# Patient Record
Sex: Female | Born: 1955 | Race: White | Hispanic: No
Health system: Midwestern US, Community
[De-identification: ages and names within clinical notes are randomized; demographics above are authoritative.]

## PROBLEM LIST (undated history)

## (undated) DIAGNOSIS — J989 Respiratory disorder, unspecified: Secondary | ICD-10-CM

---

## 2014-05-13 ENCOUNTER — Encounter: Attending: Critical Care Medicine

## 2016-02-17 ENCOUNTER — Ambulatory Visit: Admit: 2016-02-17 | Discharge: 2016-02-17 | Payer: PRIVATE HEALTH INSURANCE | Attending: Adult Health

## 2016-02-17 DIAGNOSIS — J069 Acute upper respiratory infection, unspecified: Secondary | ICD-10-CM

## 2016-02-17 MED ORDER — HYDROCODONE 10 MG-CHLORPHENIRAMINE 8 MG/5 ML ORAL SUSP EXTEND.REL 12HR
10-8 mg/5 mL | Freq: Two times a day (BID) | ORAL | 0 refills | Status: AC | PRN
Start: 2016-02-17 — End: 2016-02-27

## 2016-02-17 MED ORDER — ALBUTEROL SULFATE HFA 90 MCG/ACTUATION AEROSOL INHALER
90 mcg/actuation | RESPIRATORY_TRACT | 1 refills | Status: AC | PRN
Start: 2016-02-17 — End: ?

## 2016-02-17 NOTE — Progress Notes (Addendum)
[facility]  A member of [facility]  [address] [place], [place] [address] * P - [phone] *F - [phone]      Office Progress Note    Primary Care Physician: None     CHIEF COMPLAINT:     Chief Complaint   Patient presents with   ??? Cold Symptoms     cold,dry cough,body ache,for 5 days.       HISTORY OF PRESENT ILLNESS:   Patient presents today for URI. Rates symptoms as a 7. Symptoms occur constantly. Associated symptoms are cough, body aches, nasal congestion and sore throat. Symptoms have been present for  5 days. There are no relieving factors.  There are no worsening factors.     Vitals:    02/17/16 1201   BP: 114/78   Pulse: 78   Resp: 16   Temp: 98.1 ??F (36.7 ??C)   SpO2: 97%   Weight: 186 lb 12.8 oz (84.7 kg)   Height: 5\' 9"  (1.753 m)       Body mass index is 27.59 kg/(m^2).  Pain Scale: /10      Past Medical History:  Past Medical History:   Diagnosis Date   ??? Hypothyroidism, acquired, autoimmune        Family History:  History reviewed. No pertinent family history.    PastSurgical History:  Past Surgical History:   Procedure Laterality Date   ??? HX BREAST AUGMENTATION     ??? HX LUMBAR FUSION     ??? HX ORTHOPAEDIC      foot surgery for plantar fascitis        Social History:  History   Smoking Status   ??? Former Smoker   Smokeless Tobacco   ??? Never Used     History   Alcohol Use   ??? Yes     Social History     Social History Narrative    Job: teacher       Allergy:  No Known Allergies    Medication:   Outpatient Prescriptions Marked as Taking for the 02/17/16 encounter (Office Visit) with Pamalee Leyden, APN   Medication Sig Dispense Refill   ??? levothyroxine (SYNTHROID) 150 mcg tablet Take  by mouth Daily (before breakfast).     ??? albuterol (PROVENTIL HFA, VENTOLIN HFA, PROAIR HFA) 90 mcg/actuation inhaler Take 2 Puffs by inhalation every four (4) hours as needed for Wheezing. 1 Inhaler 1   ??? chlorpheniramine-HYDROcodone (TUSSIONEX) 10-8 mg/5 mL suspension Take 5  mL by mouth every twelve (12) hours as needed for Cough for up to 10 days. Max Daily Amount: 10 mL. 115 mL 0         REVIEW OF SYSTEMS:     General: Patient denies fever, chills, or fatigue.  HEENT: Denies headache and dizziness. Denies eye pain, tearing or discharge. Denies difficulty seeing or blurred vision. Denies ear pain, discharge, tinnitus or change in hearing. C/o little nasal congestion  Lymph nodes:  Denies swelling or tenderness of lymph nodes.   Chest & Lungs: Denies difficulty breathing or shortness of breath. C/o dry cough  Heart: Denies chest pain or palpitations.   Skin: Denies changes in skin texture, pigmentation. Denies itching, rash, or eruption.   Musculoskeletal: Denies joint stiffness. Denies pain or restriction of ROM.   Neurologic: Denies syncope, seizures, numbness, slurring of speech, headache, loss of memory, and loss of consciousness.  Psychiatric: Denies disturbance of thoughts, mood changes, difficulty concentrating, sleep disturbances,  anxiety, or depression. Denies suicidal thoughts.           PHYSICAL EXAM:    GENERAL: No signs of distress. Alert and cooperative. Well groomed.   HEENT: Normocephalic. Facial symmetry present. Eyes conjunctivae pink, no tearing, no discharge. Sclera white. Cornea clear. Ears normal exam. Tympanic membrane translucent, pearly gray, with no perforations present. Nose & sinuses no nasal flaring. Mucosa pink and moist, no polyps or tenderness. Septum midline. Sinuses non-tender. No abnormal findings in throat or mouth.  NECK: No jugular vein distention. Trachea midline. Thyroid gland smooth, flat, not enlarges, non tender.  LYMPH: Lymph nodes nonpalpable.   HEART: S1S2 regular rate and rhythm. No murmurs, rub, thrills, bruits, or heaves. No extra heart sounds heard.   LUNGS: Clear to auscultation. No adventitious breath sounds.   SKIN: No rashes or lesions. Skin warm to touch. Turgor resilient. Nails  pink, smooth and cleans. Nails adhered to nail bed with brisk capillary refills.   MUSCULOSKELETAL:  Upper and lower extremities reveal no loss of strength or motion. There is no sensory deficit or instability noted. ROM is full without pain, locking or clicking.  NEUROLOGIC: Cranial nerves 2-12 grossly intact. The patient is alert and oriented x3. Normal mental status without any focal deficits.  PSYCH: The patient's affect is grossly normal, good eye contact, good affect, no suicidal ideations.       Data reviewed.  No results found for any visits on 02/17/16. .      ASSESSMENT:   1. Viral upper respiratory infection    2. Cough        PLAN:     Orders Placed This Encounter   ??? levothyroxine (SYNTHROID) 150 mcg tablet     Sig: Take  by mouth Daily (before breakfast).   ??? albuterol (PROVENTIL HFA, VENTOLIN HFA, PROAIR HFA) 90 mcg/actuation inhaler     Sig: Take 2 Puffs by inhalation every four (4) hours as needed for Wheezing.     Dispense:  1 Inhaler     Refill:  1   ??? chlorpheniramine-HYDROcodone (TUSSIONEX) 10-8 mg/5 mL suspension     Sig: Take 5 mL by mouth every twelve (12) hours as needed for Cough for up to 10 days. Max Daily Amount: 10 mL.     Dispense:  115 mL     Refill:  0       Discussed diagnosis and treatment.   Patient is tolerating all medications as prescribed with no barriers to adherence noted.  Medications discussed, including all new medications prescribed at this visit.  Indications and side effects reviewed with the patient/family/caregiver with understanding verbalized  I advised patient to call me if any unusual symptoms occur.    I have reviewed/discussed the above normal BMI with the patient.  I have recommended the following interventions: dietary management education, guidance, and counseling . .      Ms. Gerhart has a reminder for a "due or due soon" health maintenance. I have asked that she contact her primary care provider for follow-up on this health maintenance.       Follow-up Disposition:  Return if symptoms worsen or fail to improve.      Pamalee Leyden, APN         Patient Instructions          Cough: Care Instructions  Your Care Instructions    A cough is your body's response to something that bothers your throat or airways. Many things can cause a cough.  You might cough because of a cold or the flu, bronchitis, or asthma. Smoking, postnasal drip, allergies, and stomach acid that backs up into your throat also can cause coughs.  A cough is a symptom, not a disease. Most coughs stop when the cause, such as a cold, goes away. You can take a few steps at home to cough less and feel better.  Follow-up care is a key part of your treatment and safety. Be sure to make and go to all appointments, and call your doctor if you are having problems. It's also a good idea to know your test results and keep a list of the medicines you take.  How can you care for yourself at home?  ?? Drink lots of water and other fluids. This helps thin the mucus and soothes a dry or sore throat. Honey or lemon juice in hot water or tea may ease a dry cough.  ?? Take cough medicine as directed by your doctor.  ?? Prop up your head on pillows to help you breathe and ease a dry cough.  ?? Try cough drops to soothe a dry or sore throat. Cough drops don't stop a cough. Medicine-flavored cough drops are no better than candy-flavored drops or hard candy.  ?? Do not smoke. Avoid secondhand smoke. If you need help quitting, talk to your doctor about stop-smoking programs and medicines. These can increase your chances of quitting for good.  When should you call for help?  Call 911 anytime you think you may need emergency care. For example, call if:  ? ?? You have severe trouble breathing.   ?Call your doctor now or seek immediate medical care if:  ? ?? You cough up blood.   ? ?? You have new or worse trouble breathing.   ? ?? You have a new or higher fever.   ? ?? You have a new rash.    ?Watch closely for changes in your health, and be sure to contact your doctor if:  ? ?? You cough more deeply or more often, especially if you notice more mucus or a change in the color of your mucus.   ? ?? You have new symptoms, such as a sore throat, an earache, or sinus pain.   ? ?? You do not get better as expected.   Where can you learn more?  Go to [employer]Stats[employer]cahttp://www.healthwise.net/GoodHelpConnections.  Enter D279 in the search box to learn more about "Cough: Care Instructions."  Current as of: Jul 30, 2015  Content Version: 11.4  ?? 2006-2017 [employer]. Care instructions adapted under license by [employer] (which disclaims liability or warranty for this information). If you have questions about a medical condition or this instruction, always ask your healthcare professional. [employer]wise, Inc[employer] disclaims any warranty or liability for your use of this information.       Learning About COPD and Upper Respiratory Infections  What are upper respiratory infections?    An upper respiratory infection, also called a URI, is an infection of the nose, sinuses, or throat. Viruses or bacteria can cause URIs. Colds, the flu, and sinusitis are examples of URIs. These infections are spread by coughs, sneezes, and close contact.  How do these infections affect COPD?  A URI can worsen COPD symptoms, such as having too much mucus in your lungs, coughing, or being short of breath.  What can you do to manage most infections at home?  ?? Do not smoke. Avoid secondhand smoke.  ?? Take  an over-the-counter pain medicine, such as acetaminophen (Tylenol), ibuprofen (Advil, Motrin), or naproxen (Aleve). Read and follow all instructions on the label.  ?? Be careful when taking over-the-counter cold or flu medicines and Tylenol at the same time. Many of these medicines have acetaminophen, which is Tylenol. Read the labels to make sure that you are not taking  more than the recommended dose. Too much acetaminophen (Tylenol) can be harmful.  ?? If your doctor prescribed antibiotics, take them as directed. Do not stop taking them just because you feel better. You need to take the full course of antibiotics.  ?? Ask your doctor about cough medicines and decongestants. Some doctors recommend these medicines, while others feel that they do not help.  ?? Learn breathing techniques for COPD, such as breathing through pursed lips. These techniques can help you breathe easier.  What can you do to prevent these infections?  Stay healthy  ?? Do not smoke. This is the most important step you can take to prevent more damage to your lungs. If you need help quitting, talk to your doctor about stop-smoking programs and medicines. These can increase your chances of quitting for good.  ?? Avoid secondhand smoke, air pollution, and high altitudes. Also avoid cold, dry air and hot, humid air. Stay at home with your windows closed when air pollution is bad.  ?? Get a flu shot every year.  ?? Get a pneumococcal vaccine shot. If you have had one before, ask your doctor whether you need another dose.  ?? If you must be around people with colds or the flu, wash your hands often.  Exercise and eat well  ?? If your doctor recommends it, get more exercise. Walking is a good choice. Bit by bit, increase the amount you walk every day. Try for at least 30 minutes on most days of the week.  ?? Eat regular, well-balanced meals. Eating right keeps your energy levels up and helps your body fight infection.  ?? Get plenty of rest and sleep.  Follow-up care is a key part of your treatment and safety. Be sure to make and go to all appointments, and call your doctor if you are having problems. It's also a good idea to know your test results and keep a list of the medicines you take.  Where can you learn more?  Go to InsuranceStats.cahttp://www.healthwise.net/GoodHelpConnections.   Enter (813) 533-4087V912 in the search box to learn more about "Learning About COPD and Upper Respiratory Infections."  Current as of: Jul 30, 2015  Content Version: 11.4  ?? 2006-2017 [employer]. Care instructions adapted under license by [employer] (which disclaims liability or warranty for this information). If you have questions about a medical condition or this instruction, always ask your healthcare professional. [employer]wise, [employer] disclaims any warranty or liability for your use of this information.       Cough: Care Instructions  Your Care Instructions    A cough is your body's response to something that bothers your throat or airways. Many things can cause a cough. You might cough because of a cold or the flu, bronchitis, or asthma. Smoking, postnasal drip, allergies, and stomach acid that backs up into your throat also can cause coughs.  A cough is a symptom, not a disease. Most coughs stop when the cause, such as a cold, goes away. You can take a few steps at home to cough less and feel better.  Follow-up care is a key part of your treatment and safety.  Be sure to make and go to all appointments, and call your doctor if you are having problems. It's also a good idea to know your test results and keep a list of the medicines you take.  How can you care for yourself at home?  ?? Drink lots of water and other fluids. This helps thin the mucus and soothes a dry or sore throat. Honey or lemon juice in hot water or tea may ease a dry cough.  ?? Take cough medicine as directed by your doctor.  ?? Prop up your head on pillows to help you breathe and ease a dry cough.  ?? Try cough drops to soothe a dry or sore throat. Cough drops don't stop a cough. Medicine-flavored cough drops are no better than candy-flavored drops or hard candy.  ?? Do not smoke. Avoid secondhand smoke. If you need help quitting, talk to your doctor about stop-smoking programs and medicines. These can increase  your chances of quitting for good.  When should you call for help?  Call 911 anytime you think you may need emergency care. For example, call if:  ? ?? You have severe trouble breathing.   ?Call your doctor now or seek immediate medical care if:  ? ?? You cough up blood.   ? ?? You have new or worse trouble breathing.   ? ?? You have a new or higher fever.   ? ?? You have a new rash.   ?Watch closely for changes in your health, and be sure to contact your doctor if:  ? ?? You cough more deeply or more often, especially if you notice more mucus or a change in the color of your mucus.   ? ?? You have new symptoms, such as a sore throat, an earache, or sinus pain.   ? ?? You do not get better as expected.   Where can you learn more?  Go to [employer].  Enter D279 in the search box to learn more about "Cough: Care Instructions."  Current as of: Jul 30, 2015  Content Version: 11.4  ?? 2006-2017 [employer]. Care instructions adapted under license by [employer] (which disclaims liability or warranty for this information). If you have questions about a medical condition or this instruction, always ask your healthcare professional. [employer] disclaims any warranty or liability for your use of this information.       Upper Respiratory Infection (Cold): Care Instructions  Your Care Instructions    An upper respiratory infection, or URI, is an infection of the nose, sinuses, or throat. URIs are spread by coughs, sneezes, and direct contact. The common cold is the most frequent kind of URI. The flu and sinus infections are other kinds of URIs.  Almost all URIs are caused by viruses. Antibiotics won't cure them. But you can treat most infections with home care. This may include drinking lots of fluids and taking over-the-counter pain medicine. You will probably feel better in 4 to 10 days.  The doctor has checked you carefully, but problems can develop later. If  you notice any problems or new symptoms, get medical treatment right away.  Follow-up care is a key part of your treatment and safety. Be sure to make and go to all appointments, and call your doctor if you are having problems. It's also a good idea to know your test results and keep a list of the medicines you take.  How can you care for yourself at home?  ??  To prevent dehydration, drink plenty of fluids, enough so that your urine is light yellow or clear like water. Choose water and other caffeine-free clear liquids until you feel better. If you have kidney, heart, or liver disease and have to limit fluids, talk with your doctor before you increase the amount of fluids you drink.  ?? Take an over-the-counter pain medicine, such as acetaminophen (Tylenol), ibuprofen (Advil, Motrin), or naproxen (Aleve). Read and follow all instructions on the label.  ?? Before you use cough and cold medicines, check the label. These medicines may not be safe for young children or for people with certain health problems.  ?? Be careful when taking over-the-counter cold or flu medicines and Tylenol at the same time. Many of these medicines have acetaminophen, which is Tylenol. Read the labels to make sure that you are not taking more than the recommended dose. Too much acetaminophen (Tylenol) can be harmful.  ?? Get plenty of rest.  ?? Do not smoke or allow others to smoke around you. If you need help quitting, talk to your doctor about stop-smoking programs and medicines. These can increase your chances of quitting for good.  When should you call for help?  Call 911 anytime you think you may need emergency care. For example, call if:  ? ?? You have severe trouble breathing.   ?Call your doctor now or seek immediate medical care if:  ? ?? You seem to be getting much sicker.   ? ?? You have new or worse trouble breathing.   ? ?? You have a new or higher fever.   ? ?? You have a new rash.    ?Watch closely for changes in your health, and be sure to contact your doctor if:  ? ?? You have a new symptom, such as a sore throat, an earache, or sinus pain.   ? ?? You cough more deeply or more often, especially if you notice more mucus or a change in the color of your mucus.   ? ?? You do not get better as expected.   Where can you learn more?  Go to [employer].  Enter [phone] in the search box to learn more about "Upper Respiratory Infection (Cold): Care Instructions."  Current as of: Jul 30, 2015  Content Version: 11.4  ?? 2006-2017 [employer]. Care instructions adapted under license by [employer] (which disclaims liability or warranty for this information). If you have questions about a medical condition or this instruction, always ask your healthcare professional. [employer], [employer] disclaims any warranty or liability for your use of this information.          02/17/16    I reviewed the patient's medical history, the nurse practitioner's findings on physical examination, the patient's diagnoses, and treatment plan as documented in the progress note. I concur with the treatment plan as documented. There are no additional recommendations at this time.    Lanelle Bal, DO

## 2016-02-17 NOTE — Patient Instructions (Signed)
Cough: Care Instructions  Your Care Instructions    A cough is your body's response to something that bothers your throat or airways. Many things can cause a cough. You might cough because of a cold or the flu, bronchitis, or asthma. Smoking, postnasal drip, allergies, and stomach acid that backs up into your throat also can cause coughs.  A cough is a symptom, not a disease. Most coughs stop when the cause, such as a cold, goes away. You can take a few steps at home to cough less and feel better.  Follow-up care is a key part of your treatment and safety. Be sure to make and go to all appointments, and call your doctor if you are having problems. It's also a good idea to know your test results and keep a list of the medicines you take.  How can you care for yourself at home?  ?? Drink lots of water and other fluids. This helps thin the mucus and soothes a dry or sore throat. Honey or lemon juice in hot water or tea may ease a dry cough.  ?? Take cough medicine as directed by your doctor.  ?? Prop up your head on pillows to help you breathe and ease a dry cough.  ?? Try cough drops to soothe a dry or sore throat. Cough drops don't stop a cough. Medicine-flavored cough drops are no better than candy-flavored drops or hard candy.  ?? Do not smoke. Avoid secondhand smoke. If you need help quitting, talk to your doctor about stop-smoking programs and medicines. These can increase your chances of quitting for good.  When should you call for help?  Call 911 anytime you think you may need emergency care. For example, call if:  ? ?? You have severe trouble breathing.   ?Call your doctor now or seek immediate medical care if:  ? ?? You cough up blood.   ? ?? You have new or worse trouble breathing.   ? ?? You have a new or higher fever.   ? ?? You have a new rash.   ?Watch closely for changes in your health, and be sure to contact your doctor if:  ? ?? You cough more deeply or more often, especially if you notice more  mucus or a change in the color of your mucus.   ? ?? You have new symptoms, such as a sore throat, an earache, or sinus pain.   ? ?? You do not get better as expected.   Where can you learn more?  Go to [employer]cahttp://www.healthwise.net/GoodHelpConnections.  Enter D279 in the search box to learn more about "Cough: Care Instructions."  Current as of: Jul 30, 2015  Content Version: 11.4  ?? 2006-2017 [employer]. Care instructions adapted under license by [employer] (which disclaims liability or warranty for this information). If you have questions about a medical condition or this instruction, always ask your healthcare professional. [employer] disclaims any warranty or liability for your use of this information.       Learning About COPD and Upper Respiratory Infections  What are upper respiratory infections?    An upper respiratory infection, also called a URI, is an infection of the nose, sinuses, or throat. Viruses or bacteria can cause URIs. Colds, the flu, and sinusitis are examples of URIs. These infections are spread by coughs, sneezes, and close contact.  How do these infections affect COPD?  A URI can worsen COPD symptoms, such as having too much mucus in  your lungs, coughing, or being short of breath.  What can you do to manage most infections at home?  ?? Do not smoke. Avoid secondhand smoke.  ?? Take an over-the-counter pain medicine, such as acetaminophen (Tylenol), ibuprofen (Advil, Motrin), or naproxen (Aleve). Read and follow all instructions on the label.  ?? Be careful when taking over-the-counter cold or flu medicines and Tylenol at the same time. Many of these medicines have acetaminophen, which is Tylenol. Read the labels to make sure that you are not taking more than the recommended dose. Too much acetaminophen (Tylenol) can be harmful.  ?? If your doctor prescribed antibiotics, take them as directed. Do not  stop taking them just because you feel better. You need to take the full course of antibiotics.  ?? Ask your doctor about cough medicines and decongestants. Some doctors recommend these medicines, while others feel that they do not help.  ?? Learn breathing techniques for COPD, such as breathing through pursed lips. These techniques can help you breathe easier.  What can you do to prevent these infections?  Stay healthy  ?? Do not smoke. This is the most important step you can take to prevent more damage to your lungs. If you need help quitting, talk to your doctor about stop-smoking programs and medicines. These can increase your chances of quitting for good.  ?? Avoid secondhand smoke, air pollution, and high altitudes. Also avoid cold, dry air and hot, humid air. Stay at home with your windows closed when air pollution is bad.  ?? Get a flu shot every year.  ?? Get a pneumococcal vaccine shot. If you have had one before, ask your doctor whether you need another dose.  ?? If you must be around people with colds or the flu, wash your hands often.  Exercise and eat well  ?? If your doctor recommends it, get more exercise. Walking is a good choice. Bit by bit, increase the amount you walk every day. Try for at least 30 minutes on most days of the week.  ?? Eat regular, well-balanced meals. Eating right keeps your energy levels up and helps your body fight infection.  ?? Get plenty of rest and sleep.  Follow-up care is a key part of your treatment and safety. Be sure to make and go to all appointments, and call your doctor if you are having problems. It's also a good idea to know your test results and keep a list of the medicines you take.  Where can you learn more?  Go to InsuranceStats.cahttp://www.healthwise.net/GoodHelpConnections.  Enter [phone] in the search box to learn more about "Learning About COPD and Upper Respiratory Infections."  Current as of: Jul 30, 2015  Content Version: 11.4   ?? 2006-2017 [employer]. Care instructions adapted under license by [employer] (which disclaims liability or warranty for this information). If you have questions about a medical condition or this instruction, always ask your healthcare professional. [employer]wise, [employer] disclaims any warranty or liability for your use of this information.       Cough: Care Instructions  Your Care Instructions    A cough is your body's response to something that bothers your throat or airways. Many things can cause a cough. You might cough because of a cold or the flu, bronchitis, or asthma. Smoking, postnasal drip, allergies, and stomach acid that backs up into your throat also can cause coughs.  A cough is a symptom, not a disease. Most coughs stop when the cause, such as  a cold, goes away. You can take a few steps at home to cough less and feel better.  Follow-up care is a key part of your treatment and safety. Be sure to make and go to all appointments, and call your doctor if you are having problems. It's also a good idea to know your test results and keep a list of the medicines you take.  How can you care for yourself at home?  ?? Drink lots of water and other fluids. This helps thin the mucus and soothes a dry or sore throat. Honey or lemon juice in hot water or tea may ease a dry cough.  ?? Take cough medicine as directed by your doctor.  ?? Prop up your head on pillows to help you breathe and ease a dry cough.  ?? Try cough drops to soothe a dry or sore throat. Cough drops don't stop a cough. Medicine-flavored cough drops are no better than candy-flavored drops or hard candy.  ?? Do not smoke. Avoid secondhand smoke. If you need help quitting, talk to your doctor about stop-smoking programs and medicines. These can increase your chances of quitting for good.  When should you call for help?  Call 911 anytime you think you may need emergency care. For example, call if:   ? ?? You have severe trouble breathing.   ?Call your doctor now or seek immediate medical care if:  ? ?? You cough up blood.   ? ?? You have new or worse trouble breathing.   ? ?? You have a new or higher fever.   ? ?? You have a new rash.   ?Watch closely for changes in your health, and be sure to contact your doctor if:  ? ?? You cough more deeply or more often, especially if you notice more mucus or a change in the color of your mucus.   ? ?? You have new symptoms, such as a sore throat, an earache, or sinus pain.   ? ?? You do not get better as expected.   Where can you learn more?  Go to [employer].  Enter D279 in the search box to learn more about "Cough: Care Instructions."  Current as of: Jul 30, 2015  Content Version: 11.4  ?? 2006-2017 [employer]. Care instructions adapted under license by [employer] (which disclaims liability or warranty for this information). If you have questions about a medical condition or this instruction, always ask your healthcare professional. [employer] disclaims any warranty or liability for your use of this information.       Upper Respiratory Infection (Cold): Care Instructions  Your Care Instructions    An upper respiratory infection, or URI, is an infection of the nose, sinuses, or throat. URIs are spread by coughs, sneezes, and direct contact. The common cold is the most frequent kind of URI. The flu and sinus infections are other kinds of URIs.  Almost all URIs are caused by viruses. Antibiotics won't cure them. But you can treat most infections with home care. This may include drinking lots of fluids and taking over-the-counter pain medicine. You will probably feel better in 4 to 10 days.  The doctor has checked you carefully, but problems can develop later. If you notice any problems or new symptoms, get medical treatment right away.   Follow-up care is a key part of your treatment and safety. Be sure to make and go to all appointments, and call your doctor if you are having problems.  It's also a good idea to know your test results and keep a list of the medicines you take.  How can you care for yourself at home?  ?? To prevent dehydration, drink plenty of fluids, enough so that your urine is light yellow or clear like water. Choose water and other caffeine-free clear liquids until you feel better. If you have kidney, heart, or liver disease and have to limit fluids, talk with your doctor before you increase the amount of fluids you drink.  ?? Take an over-the-counter pain medicine, such as acetaminophen (Tylenol), ibuprofen (Advil, Motrin), or naproxen (Aleve). Read and follow all instructions on the label.  ?? Before you use cough and cold medicines, check the label. These medicines may not be safe for young children or for people with certain health problems.  ?? Be careful when taking over-the-counter cold or flu medicines and Tylenol at the same time. Many of these medicines have acetaminophen, which is Tylenol. Read the labels to make sure that you are not taking more than the recommended dose. Too much acetaminophen (Tylenol) can be harmful.  ?? Get plenty of rest.  ?? Do not smoke or allow others to smoke around you. If you need help quitting, talk to your doctor about stop-smoking programs and medicines. These can increase your chances of quitting for good.  When should you call for help?  Call 911 anytime you think you may need emergency care. For example, call if:  ? ?? You have severe trouble breathing.   ?Call your doctor now or seek immediate medical care if:  ? ?? You seem to be getting much sicker.   ? ?? You have new or worse trouble breathing.   ? ?? You have a new or higher fever.   ? ?? You have a new rash.   ?Watch closely for changes in your health, and be sure to contact your doctor if:   ? ?? You have a new symptom, such as a sore throat, an earache, or sinus pain.   ? ?? You cough more deeply or more often, especially if you notice more mucus or a change in the color of your mucus.   ? ?? You do not get better as expected.   Where can you learn more?  Go to [employer].  Enter [phone] in the search box to learn more about "Upper Respiratory Infection (Cold): Care Instructions."  Current as of: Jul 30, 2015  Content Version: 11.4  ?? 2006-2017 [employer]. Care instructions adapted under license by [employer] (which disclaims liability or warranty for this information). If you have questions about a medical condition or this instruction, always ask your healthcare professional. [employer], [employer] disclaims any warranty or liability for your use of this information.

## 2016-02-18 MED ORDER — AMOXICILLIN CLAVULANATE 875 MG-125 MG TAB
875-125 mg | ORAL_TABLET | Freq: Two times a day (BID) | ORAL | 0 refills | Status: AC
Start: 2016-02-18 — End: 2016-02-28

## 2016-02-18 NOTE — Telephone Encounter (Signed)
Called in antibiotic  Katherine LeydenAshley A Zuniga Morissette, APN

## 2016-02-18 NOTE — Telephone Encounter (Signed)
Called patient and gave her the message.

## 2016-02-18 NOTE — Telephone Encounter (Signed)
Patient wanted to let you know her sputum was green. Said you would prescribe something for her.

## 2016-03-08 ENCOUNTER — Encounter

## 2016-03-08 ENCOUNTER — Ambulatory Visit: Admit: 2016-03-08 | Discharge: 2016-03-08 | Payer: PRIVATE HEALTH INSURANCE | Attending: Pulmonary Disease

## 2016-03-08 DIAGNOSIS — R0989 Other specified symptoms and signs involving the circulatory and respiratory systems: Secondary | ICD-10-CM

## 2016-03-08 LAB — AMB POC PFT FULLSET (WEST NYACK)
DLCOunc: 29.61 ml/min/mmHg
FEV1/FVC: 75 %
FEV1: 2.91 L
FVC: 3.87 L
RV (Pleth): 2.72 L
TLC (Pleth): 6.35 L

## 2016-03-08 NOTE — Patient Instructions (Signed)
Albuterol 2 puffs up to 4 x day, only as needed    Tessalon 3 x day as needed      Mucinex  2 x day

## 2016-03-08 NOTE — Progress Notes (Signed)
[facility]    Pulmonary Consultation    Primary Care Physician: None     Reason for Visit:  sob    History of Present Illness:  Katherine Zuniga is a 60 y.o. female patient who presents with >ppd for 15 years, stopped  15 years ago.  Low dose CAT 2 years ago at [facility].    Sick for 2-3 weeks with persistent dry cough.  No fever, chills, sweats. AM scant sputum, sp Amoxicillin.    Still coughing, fatigued.  No wheezing, sob, chest pain, or edema.      Review of Systems:  General: No fever or chills  ENT: No earache or sore throat  Cardiac:  no chest pain or palpitations  Skin: No bleeding or bruising  Endo: No polyuria,  polydipsia  GI: no nausea,  vomiting  GU: no dysuria or discharge  Neuro: No headache or paresthesias  Respiratory: HPI  Muscular: No arthralgias or arthritis    Past Medical History:  Past Medical History:   Diagnosis Date   ??? Hypothyroidism, acquired, autoimmune        PastSurgical History:  Past Surgical History:   Procedure Laterality Date   ??? HX BREAST AUGMENTATION     ??? HX COLONOSCOPY      rectocele   ??? HX LUMBAR FUSION     ??? HX ORTHOPAEDIC      foot surgery for plantar fascitis        Family History:  No family history on file.    Social History:  Social History     Social History   ??? Marital status: UNKNOWN     Spouse name: N/A   ??? Number of children: N/A   ??? Years of education: N/A     Occupational History   ??? Not on file.     Social History Main Topics   ??? Smoking status: Former Smoker   ??? Smokeless tobacco: Never Used   ??? Alcohol use Yes   ??? Drug use: Not on file   ??? Sexual activity: Not on file     Other Topics Concern   ??? Not on file     Social History Narrative    Job: teacher         Allergy:  No Known Allergies    Medication:  Current Outpatient Prescriptions   Medication Sig Dispense Refill   ??? levothyroxine (SYNTHROID) 150 mcg tablet Take  by mouth Daily (before breakfast).     ??? albuterol (PROVENTIL HFA, VENTOLIN HFA, PROAIR HFA) 90 mcg/actuation  inhaler Take 2 Puffs by inhalation every four (4) hours as needed for Wheezing. 1 Inhaler 1         .    Physical Exam:  Vitals:    03/08/16 1521   BP: 124/85   Pulse: 87   SpO2: 98%   Weight: 83.9 kg (185 lb)   Height: 5\' 8"  (1.727 m)     Body mass index is 28.13 kg/(m^2).  General:  well-developed well-nourished no acute distress  ENT:  normal nasal mucosa teeth and, normal  Neck:   supple soft no JVD  Cardiac:  regular rhythm no murmur rub or gallop  GI:  abdomen is soft nontender without masses or organomegaly  Respiratory:  symmetric expansion, resonant, clear no wheezes rales or rubs even with a forced expiratory maneuver  Lymphatics:  no nodes in the neck   Musculoskeletal:  equal strength bilaterally,  gait normal  Extremities: no clubbing,  cyanosis or edema  Skin: warm and dry  Neuro: alert, oriented x 3    Data:     Results for orders placed or performed in visit on 03/08/16   AMB POC PFT FULLSET ([facility])   Result Value Ref Range    FVC 3.87 L    FEV1 2.91 L    FEV1/FVC 75 %    RV ([facility]) 2.72 L    TLC ([facility]) 6.35 L    DLCOunc 29.61 ml/min/mmHg    Narrative    Pulmonary function tests are normal with slight improvement post bronchodilators  FEV1 is 98% going up to 100%.  MVV goes from 80-85%  Total lung capacity 112% with a residual volume 123%  Single breath diffusion normal at 108% of predicted   AMB POC XRAY, CHEST, 2 VIEWS, FRONTAL    Narrative    Chest x-ray is normal.  Heart size is normal and there are no acute infiltrates atelectasis or effusions         Impression:  ?? Cough secondary resolving bronchitis  ?? Normal pulmonary function tests    Past Medical History:   Diagnosis Date   ??? Hypothyroidism, acquired, autoimmune                Plan:  ?? Patient is clinically improving  ?? Exam is clear, chest x-ray is normal, and pulmonary function tests are normal, with slight improvement post bronchodilators  ?? We will treat for symptomatic relief with albuterol 2 puffs up to 4 times a day as needed   ?? Tessalon [place] 3 times a day as needed.  Mucinex twice a day as needed  ?? Patient aware to call if problems persist or worsen.  ??   ?? Thanks for the courtesy of consultation    Discussed diagnosis, its evaluation, treatment and usual course.  All questions answered.    Discussed medication dosage, use,  side effects, and goals of treatment in detail.    Patient is aware to call if symptoms persist or worsen.  Jari FavreLeon S Kaidan Harpster, MD     Note: This report was transcribed using voice recognition software and is thus prone to syntax and contextual spelling errors..  Please do not hesitate to call me if anything needs clarification.

## 2020-09-30 ENCOUNTER — Inpatient Hospital Stay
Admit: 2020-09-30 | Discharge: 2020-09-30 | Disposition: A | Discharge status: Home / Self Care | Payer: BLUE CROSS/BLUE SHIELD | Attending: Emergency Medicine

## 2020-09-30 ENCOUNTER — Emergency Department: Admit: 2020-09-30 | Payer: BLUE CROSS/BLUE SHIELD

## 2020-09-30 DIAGNOSIS — K5792 Diverticulitis of intestine, part unspecified, without perforation or abscess without bleeding: Secondary | ICD-10-CM

## 2020-09-30 LAB — COMPREHENSIVE METABOLIC PANEL
ALT: 11 U/L (ref 10–49)
AST: 16 U/L (ref 0–33.9)
Albumin/Globulin Ratio: 1.4 (ref 0.7–2.8)
Albumin: 3.8 g/dL (ref 3.48–5.0)
Alkaline Phosphatase: 66 U/L (ref 46–116)
Anion Gap: 8 mmol/L — ABNORMAL LOW (ref 10–20)
BUN: 14 mg/dL (ref 9–23)
CO2: 31 mmol/L (ref 20.0–31.0)
Calcium: 9.3 mg/dL (ref 8.7–10.4)
Chloride: 103 mmol/L (ref 98–107)
Creatinine: 0.72 mg/dL (ref 0.55–1.02)
EGFR IF NonAfrican American: >60 mL/min/{1.73_m2} (ref >60)
GFR African American: >60 mL/min/{1.73_m2} (ref >60)
Globulin: 2.8 g/dL (ref 1.7–4.7)
Glucose: 93 mg/dL (ref 74–106)
Potassium: 4 mmol/L (ref 3.4–5.1)
Sodium: 138 mmol/L (ref 136–145)
Total Bilirubin: 0.5 mg/dL (ref 0.2–1.1)
Total Protein: 6.6 g/dL (ref 5.7–8.2)

## 2020-09-30 LAB — CBC WITH AUTO DIFFERENTIAL
Basophils %: 0 % (ref 0.0–3.0)
Basophils Absolute: 0 10*3/uL (ref 0.0–0.4)
Eosinophils %: 2 % (ref 0.0–7.0)
Eosinophils Absolute: 0.2 10*3/uL (ref 0.0–1.0)
Granulocyte Absolute Count: 0 10*3/uL (ref 0.0–0.17)
Hematocrit: 37.2 % (ref 36.0–47.0)
Hemoglobin: 12 g/dL (ref 12.0–16.0)
Immature Granulocytes: 0 % (ref 0–0.5)
Lymphocytes %: 28 % (ref 18.0–40.0)
Lymphocytes Absolute: 2.3 10*3/uL (ref 0.9–4.2)
MCH: 30.3 PG (ref 27.0–35.0)
MCHC: 32.3 g/dL (ref 30.7–37.3)
MCV: 93.9 FL (ref 81.0–94.0)
MPV: 10.6 FL (ref 9.2–11.8)
Monocytes %: 9 % (ref 2.0–12.0)
Monocytes Absolute: 0.7 10*3/uL (ref 0.1–1.7)
NRBC Absolute: 0 10*3/uL (ref 0.0–0.01)
Neutrophils %: 60 % (ref 48.0–72.0)
Neutrophils Absolute: 4.9 10*3/uL (ref 2.3–7.6)
Nucleated RBCs: 0 PER 100 WBC
Platelets: 246 10*3/uL (ref 130–400)
RBC: 3.96 M/uL — ABNORMAL LOW (ref 4.20–5.40)
RDW: 12 % (ref 11.5–14.0)
WBC: 8.1 10*3/uL (ref 4.8–10.6)

## 2020-09-30 LAB — PROTIME-INR
INR: 0.9 (ref 0.8–1.2)
Protime: 9.9 s (ref 9.4–11.1)

## 2020-09-30 LAB — URINALYSIS W/ RFLX MICROSCOPIC
Bilirubin, Urine: NEGATIVE
Bilirubin: NEGATIVE
Blood, Urine: NEGATIVE
Blood: NEGATIVE
Glucose, Ur: NEGATIVE mg/dL
Glucose: NEGATIVE mg/dL
Ketone: NEGATIVE mg/dL
Ketones, Urine: NEGATIVE mg/dL
Leukocyte Esterase, Urine: NEGATIVE
Leukocyte Esterase: NEGATIVE
Nitrite, Urine: NEGATIVE
Nitrites: NEGATIVE
Protein, UA: NEGATIVE mg/dL
Protein: NEGATIVE mg/dL
Specific Gravity, UA: 1.006 (ref 1.005–1.030)
Specific gravity: 1.006 (ref 1.005–1.030)
Urobilinogen, UA, POCT: 0.2 EU/dL (ref 0.2–1.0)
Urobilinogen: 0.2 EU/dL (ref 0.2–1.0)
pH (UA): 6.5
pH, UA: 6.5

## 2020-09-30 LAB — APTT: aPTT: 24.6 s (ref 21.0–28.0)

## 2020-09-30 LAB — TYPE AND SCREEN
ABO/Rh: O POS
Antibody Screen: NEGATIVE

## 2020-09-30 LAB — LIPASE
Lipase: 24 U/L (ref 12–53)
Lipase: 24 U/L (ref 12–53)

## 2020-09-30 LAB — SARS-COV-2 RNA,POC
SARS-COV-2 RNA POC: NEGATIVE
SARS-COV-2 RNA, POC: NEGATIVE

## 2020-09-30 LAB — METABOLIC PANEL, COMPREHENSIVE
A-G Ratio: 1.4 (ref 0.7–2.8)
ALT (SGPT): 11 U/L (ref 10–49)
AST (SGOT): 16 U/L (ref 0–33.9)
Albumin: 3.8 g/dL (ref 3.48–5.0)
Alk. phosphatase: 66 U/L (ref 46–116)
Anion gap: 8 mmol/L — ABNORMAL LOW (ref 10–20)
BUN: 14 mg/dL (ref 9–23)
Bilirubin, total: 0.5 mg/dL (ref 0.2–1.1)
CO2: 31 mmol/L (ref 20.0–31.0)
Calcium: 9.3 mg/dL (ref 8.7–10.4)
Chloride: 103 mmol/L (ref 98–107)
Creatinine: 0.72 mg/dL (ref 0.55–1.02)
GFR est AA: >60 mL/min/{1.73_m2} (ref >60)
GFR est non-AA: >60 mL/min/{1.73_m2} (ref >60)
Globulin: 2.8 g/dL (ref 1.7–4.7)
Glucose: 93 mg/dL (ref 74–106)
Potassium: 4 mmol/L (ref 3.4–5.1)
Protein, total: 6.6 g/dL (ref 5.7–8.2)
Sodium: 138 mmol/L (ref 136–145)

## 2020-09-30 LAB — TYPE & SCREEN
ABO/Rh(D): O POS
Antibody screen: NEGATIVE

## 2020-09-30 LAB — CBC WITH AUTOMATED DIFF
ABS. BASOPHILS: 0 10*3/uL (ref 0.0–0.4)
ABS. EOSINOPHILS: 0.2 10*3/uL (ref 0.0–1.0)
ABS. IMM. GRANS.: 0 10*3/uL (ref 0.0–0.17)
ABS. LYMPHOCYTES: 2.3 10*3/uL (ref 0.9–4.2)
ABS. MONOCYTES: 0.7 10*3/uL (ref 0.1–1.7)
ABS. NEUTROPHILS: 4.9 10*3/uL (ref 2.3–7.6)
ABSOLUTE NRBC: 0 10*3/uL (ref 0.0–0.01)
BASOPHILS: 0 % (ref 0.0–3.0)
EOSINOPHILS: 2 % (ref 0.0–7.0)
HCT: 37.2 % (ref 36.0–47.0)
HGB: 12 g/dL (ref 12.0–16.0)
IMMATURE GRANULOCYTES: 0 % (ref 0–0.5)
LYMPHOCYTES: 28 % (ref 18.0–40.0)
MCH: 30.3 PG (ref 27.0–35.0)
MCHC: 32.3 g/dL (ref 30.7–37.3)
MCV: 93.9 FL (ref 81.0–94.0)
MONOCYTES: 9 % (ref 2.0–12.0)
MPV: 10.6 FL (ref 9.2–11.8)
NEUTROPHILS: 60 % (ref 48.0–72.0)
NRBC: 0 PER 100 WBC
PLATELET: 246 10*3/uL (ref 130–400)
RBC: 3.96 M/uL — ABNORMAL LOW (ref 4.20–5.40)
RDW: 12 % (ref 11.5–14.0)
WBC: 8.1 10*3/uL (ref 4.8–10.6)

## 2020-09-30 LAB — PROTHROMBIN TIME + INR
INR: 0.9 (ref 0.8–1.2)
Prothrombin time: 9.9 s (ref 9.4–11.1)

## 2020-09-30 LAB — PTT: aPTT: 24.6 s (ref 21.0–28.0)

## 2020-09-30 MED ORDER — CIPROFLOXACIN 500 MG TAB
500 mg | ORAL_TABLET | Freq: Two times a day (BID) | ORAL | 0 refills | Status: AC
Start: 2020-09-30 — End: 2020-10-07

## 2020-09-30 MED ORDER — SODIUM CHLORIDE 0.9% BOLUS IV
0.9 % | Freq: Once | INTRAVENOUS | Status: AC
Start: 2020-09-30 — End: 2020-09-30
  Administered 2020-09-30: 19:00:00 via INTRAVENOUS

## 2020-09-30 MED ORDER — METRONIDAZOLE 500 MG TAB
500 mg | ORAL_TABLET | Freq: Three times a day (TID) | ORAL | 0 refills | Status: AC
Start: 2020-09-30 — End: 2020-10-10

## 2020-09-30 MED ORDER — CIPROFLOXACIN 500 MG TAB
500 mg | ORAL_TABLET | Freq: Two times a day (BID) | ORAL | 0 refills | Status: DC
Start: 2020-09-30 — End: 2020-09-30

## 2020-09-30 MED ORDER — DIATRIZOATE MEGLUMINE & SODIUM 66 %-10 % ORAL SOLN
66-10 % | Freq: Once | ORAL | Status: AC
Start: 2020-09-30 — End: 2020-09-30
  Administered 2020-09-30: 19:00:00 via ORAL

## 2020-09-30 MED ORDER — IOPAMIDOL 61 % IV SOLN
300 mg iodine /mL (61 %) | Freq: Once | INTRAVENOUS | Status: AC
Start: 2020-09-30 — End: 2020-09-30
  Administered 2020-09-30: 21:00:00 via INTRAVENOUS

## 2020-09-30 MED ORDER — METRONIDAZOLE 500 MG TAB
500 mg | ORAL_TABLET | Freq: Three times a day (TID) | ORAL | 0 refills | Status: DC
Start: 2020-09-30 — End: 2020-09-30

## 2020-09-30 MED FILL — GASTROGRAFIN 66 %-10 % ORAL SOLUTION: 66-10 % | ORAL | Qty: 30

## 2020-09-30 MED FILL — SODIUM CHLORIDE 0.9 % IV: INTRAVENOUS | Qty: 1000

## 2020-09-30 MED FILL — ISOVUE-300  61 % INTRAVENOUS SOLUTION: 300 mg iodine /mL (61 %) | INTRAVENOUS | Qty: 100

## 2020-09-30 NOTE — ED Provider Notes (Signed)
ED Provider Notes by Sonnie Alamo, FNP at 09/30/20 1531                Author: Sonnie Alamo, FNP  Service: EMERGENCY  Author Type: Nurse Practitioner       Filed: 09/30/20 2138  Date of Service: 09/30/20 1531  Status: Attested           Editor: Sonnie Alamo, [place] (Nurse Practitioner)  Cosigner: Ronn Melena, MD at 09/30/20 2226          Attestation signed by Ronn Melena, MD at 09/30/20 2226          I was personally available for consultation in the emergency department.  I have reviewed the chart and agree with the documentation recorded by the [facility], including  the assessment, treatment plan, and disposition.   Ronn Melena, MD                                 3:31 PM: Katherine Zuniga is a  65 y.o. female with h/o hypothyroidism who presents to the ED for evaluation of left lower  quadrant abdominal pain and nausea x2 days.  Patient reported was advised by her primary care doctor to come to the emergency room for evaluation.  Denies fever, chills, vomiting, diarrhea, chest pain, shortness of breath, or dizziness.        Past Medical History:        Diagnosis  Date         ?  Hypothyroidism, acquired, autoimmune               Past Surgical History:         Procedure  Laterality  Date          ?  HX BREAST AUGMENTATION         ?  HX COLONOSCOPY              rectocele          ?  HX LUMBAR FUSION         ?  HX ORTHOPAEDIC              foot surgery for plantar fascitis              Social History          Tobacco Use         ?  Smoking status:  Former Smoker     ?  Smokeless tobacco:  Never Used       Substance Use Topics         ?  Alcohol use:  Yes           No Known Allergies         Review of Systems    Constitutional: Positive for activity change and appetite change . Negative for chills, diaphoresis, fatigue, fever and unexpected weight change.    Eyes: Negative for visual disturbance.    Respiratory: Negative for cough, chest tightness and shortness of breath.     Cardiovascular: Negative  for chest pain, palpitations and leg swelling.    Gastrointestinal: Positive for abdominal pain and nausea . Negative for abdominal distention, anal bleeding, blood in stool, constipation, diarrhea, rectal pain and vomiting.    Genitourinary: Negative for dysuria.    Musculoskeletal: Negative for back pain and joint swelling.    Skin: Negative for rash.    Neurological: Negative  for dizziness, tremors, seizures, syncope, facial asymmetry, speech difficulty, weakness, light-headedness, numbness and headaches.           Visit Vitals      BP  (!) 145/81 (BP 1 Location: Left upper arm, BP Patient Position: At rest)     Pulse  67     Temp  (!) 96.4 ??F (35.8 ??C)     Resp  16        SpO2  96%           Physical Exam   Vitals and nursing note reviewed.   Constitutional:        General: She is not in acute distress.     Appearance: Normal appearance. She is well-developed.      Interventions: She is not intubated.  HENT:       Head: Normocephalic and atraumatic.   Cardiovascular :       Rate and Rhythm: Normal rate and regular rhythm.      Pulses: Normal pulses. No decreased pulses.      Heart sounds: Normal heart sounds. No murmur heard.        Pulmonary:       Effort: Pulmonary effort is normal. No tachypnea, bradypnea, accessory muscle usage, prolonged expiration, respiratory distress or retractions. She is not intubated.      Breath sounds: Normal  breath sounds and air entry. No stridor, decreased air movement or transmitted upper  airway sounds.    Abdominal:      General: Bowel sounds are normal. There is no distension.      Palpations: Abdomen is soft.      Tenderness: There is  abdominal tenderness in the left lower quadrant. There is no right CVA tenderness,  left CVA tenderness, guarding or rebound.      Hernia: No hernia is present.     Musculoskeletal:          General: Normal range of motion.      Cervical back: Full passive range of motion without pain, normal range of motion and neck supple. No edema,  erythema, signs of trauma, rigidity, torticollis or crepitus. No pain with movement, spinous process  tenderness or muscular tenderness. Normal range of motion.    Skin:      General: Skin is warm and dry.      Capillary Refill: Capillary refill takes less than 2 seconds.      Findings: No rash.    Neurological:       General: No focal deficit present.      Mental Status: She is alert and oriented to person, place, and time. Mental status is at baseline.      GCS: GCS eye subscore is 4 . GCS verbal subscore is 5. GCS motor subscore is 6 .      Cranial Nerves: Cranial nerves are intact. No cranial nerve deficit.      Sensory: Sensation is intact. No sensory deficit.      Motor: Motor function is intact.      Coordination: Coordination is intact.      Gait: Gait is  intact.   Psychiatric :         Attention and Perception: Attention and perception normal.         Mood and Affect: Mood and affect normal.         Speech: Speech normal.         Behavior: Behavior normal.  Thought Content: Thought content normal.          Cognition and Memory: Cognition and memory normal.         Judgment: Judgment normal.          Labs:     Recent Results (from the past 24 hour(s))     URINALYSIS W/ RFLX MICROSCOPIC          Collection Time: 09/30/20  3:15 PM         Result  Value  Ref Range            Color  YELLOW  YEL         Appearance  CLEAR  CLEAR         Specific gravity  1.006  1.005 - 1.030         pH (UA)  6.5          Protein  Negative  NEG mg/dL       Glucose  Negative  NEG mg/dL       Ketone  Negative  NEG mg/dL       Bilirubin  Negative  NEG         Blood  Negative  NEG         Urobilinogen  0.2  0.2 - 1.0 EU/dL       Nitrites  Negative  NEG         Leukocyte Esterase  Negative  NEG         METABOLIC PANEL, COMPREHENSIVE          Collection Time: 09/30/20  3:20 PM         Result  Value  Ref Range            Sodium  138  136 - 145 mmol/L       Potassium  4.0  3.4 - 5.1 mmol/L       Chloride  103  98 - 107 mmol/L        CO2  31  20.0 - 31.0 mmol/L       Anion gap  8 (L)  10 - 20 mmol/L       Glucose  93  74 - 106 mg/dL       BUN  14  9 - 23 mg/dL       Creatinine  0.72  0.55 - 1.02 mg/dL       GFR est AA  >60  >60 ml/min/1.30m       GFR est non-AA  >60  >60 ml/min/1.775m      Calcium  9.3  8.7 - 10.4 mg/dL       Bilirubin, total  0.5  0.2 - 1.1 mg/dL       ALT (SGPT)  11  10 - 49 U/L       AST (SGOT)  16  0 - 33.9 U/L       Alk. phosphatase  66  46 - 116 U/L       Protein, total  6.6  5.7 - 8.2 g/dL       Albumin  3.8  3.48 - 5.0 g/dL       Globulin  2.8  1.7 - 4.7 g/dL       A-G Ratio  1.4  0.7 - 2.8         LIPASE          Collection Time: 09/30/20  3:20 PM  Result  Value  Ref Range            Lipase  24  12 - 53 U/L       CBC WITH AUTOMATED DIFF          Collection Time: 09/30/20  3:20 PM         Result  Value  Ref Range            WBC  8.1  4.8 - 10.6 K/uL       RBC  3.96 (L)  4.20 - 5.40 M/uL       HGB  12.0  12.0 - 16.0 g/dL       HCT  37.2  36.0 - 47.0 %       MCV  93.9  81.0 - 94.0 FL       MCH  30.3  27.0 - 35.0 PG       MCHC  32.3  30.7 - 37.3 g/dL       RDW  12.0  11.5 - 14.0 %       PLATELET  246  130 - 400 K/uL       MPV  10.6  9.2 - 11.8 FL       NRBC  0.0  0 PER 100 WBC       ABSOLUTE NRBC  0.00  0.0 - 0.01 K/uL       NEUTROPHILS  60  48.0 - 72.0 %       LYMPHOCYTES  28  18.0 - 40.0 %       MONOCYTES  9  2.0 - 12.0 %       EOSINOPHILS  2  0.0 - 7.0 %       BASOPHILS  0  0.0 - 3.0 %       IMMATURE GRANULOCYTES  0  0 - 0.5 %       ABS. NEUTROPHILS  4.9  2.3 - 7.6 K/UL       ABS. LYMPHOCYTES  2.3  0.9 - 4.2 K/UL       ABS. MONOCYTES  0.7  0.1 - 1.7 K/UL       ABS. EOSINOPHILS  0.2  0.0 - 1.0 K/UL       ABS. BASOPHILS  0.0  0.0 - 0.4 K/UL       ABS. IMM. GRANS.  0.0  0.0 - 0.17 K/UL       DF  AUTOMATED          PTT          Collection Time: 09/30/20  3:20 PM         Result  Value  Ref Range            aPTT  24.6  21.0 - 28.0 SEC       PROTHROMBIN TIME + INR          Collection Time: 09/30/20  3:20 PM          Result  Value  Ref Range            Prothrombin time  9.9  9.4 - 11.1 sec       INR  0.9  0.8 - 1.2         TYPE & SCREEN          Collection Time: 09/30/20  3:20 PM         Result  Value  Ref Range  Crossmatch Expiration  10/03/2020,2359         ABO/Rh(D)  O POSITIVE         Antibody screen  NEG         EKG, 12 LEAD, INITIAL          Collection Time: 09/30/20  3:56 PM         Result  Value  Ref Range            Ventricular Rate  65  BPM       Atrial Rate  65  BPM       P-R Interval  136  ms       QRS Duration  92  ms       Q-T Interval  406  ms       QTC Calculation (Bezet)  422  ms       Calculated P Axis  25  degrees       Calculated R Axis  38  degrees       Calculated T Axis  49  degrees       Diagnosis                 Normal sinus rhythm   Normal ECG   No previous ECGs available          SARS-COV-2 RNA,POC          Collection Time: 09/30/20  4:23 PM         Result  Value  Ref Range            SARS-COV-2 RNA POC  Negative  NEG             EKG: NSR, HR 65, No ST Segment changes         Radiology:   CT ABD PELV W CONT      Result Date: 09/30/2020   History: Left lower quadrant pain. FINDINGS: CT scanning of the abdomen and pelvis was performed helically from the lung bases to the symphysis pubis after the oral ingestion of contrast material and the bolus intravenous administration of 100 cc of Isovue.  Sagittal and coronal reconstructed images are submitted. Scanning was performed utilizing dose lowering techniques. No prior studies are available for comparison. There is some dependent atelectatic change of the lung bases. Bilateral breast implants  are noted. Within the left lobe of the liver there are 2 low-density lesions. The largest of these measures approximately 2.8 cm in diameter while the smaller of these measures 1.3 cm diameter. These may represent cysts and may be better evaluated with  ultrasonography. The liver is otherwise unremarkable as is the spleen. The adrenal glands are normal in  appearance. The pancreas is grossly normal as is the gallbladder. Evaluation of the right and left kidneys is unremarkable. There is no evidence of  free fluid, free air, or abscess collection. Evaluation of the large and small bowel reveals retained fecal material without evidence of obstruction. At the level of the distal left colon just proximal to its junction with the sigmoid colon there is some  pericolonic inflammatory change. This could represent a focus of diverticulitis although no focal diverticulum is definitively seen. The possibility of a focal torsed epiploic appendagitis cannot be excluded. The remainder of the colon is unremarkable.  The appendix unremarkable without evidence of periappendiceal fat or change or fluid collection. There is no evidence of abscess collection. The abdominal aorta is normal in caliber. There is no evidence of  any pathologically enlarged para-aortic lymphadenopathy.  There is a fat-containing umbilical hernia noted.       Focus of pericolonic inflammatory change adjacent to the distal left colon. This could represent focal diverticulitis although no diverticula are definitively visualized. The possibility of epiploic appendagitis cannot be excluded. Low-density lesions  in left lower lobe which could represent cysts. Otherwise unremarkable CT scan of the abdomen and pelvis.               Procedures       <EMERGENCY DEPARTMENT CASE SUMMARY>      Impression/Differential Diagnosis: Diverticulitis, constipation, abdominal pain         MDM   Number of Diagnoses or Management Options   Diverticulitis: new,  needed workup       Amount and/or Complexity of Data Reviewed   Clinical lab tests: ordered and reviewed   Tests in the radiology section of CPT??: ordered and reviewed    Discussion of test results with the performing providers: no   Decide to obtain previous medical records or to obtain history from someone  other than the patient: yes   Obtain history from someone other  than the patient:  no   Review and summarize past medical records: yes   Discuss the patient with other providers:  no   Independent visualization of images, tracings, or specimens: yes      Patient Progress   Patient progress: stable          ED Course:    65 y.o. female presented to the  ED for evaluation of left lower quadrant abdominal pain x2 days.      Plan: Labs, urinalysis, IV fluid, CT abdomen and pelvis with oral and IV contrast, observation      4:20 PM: Patient declined analgesic in ED.  Pending CT scanning.      5:55 PM: Discussed CT results with patient.  Discussed lab results with patient.  Discussed to take medication as prescribed.  Discussed to follow a bland diet.  Discussed to follow-up with primary care doctor in gastroenterologist.  Discussed to return  to ED for worsening of symptoms: Fever, chills, abdominal pain, nausea, vomiting, or worsening of symptoms.  Patient is in agreement with plan of care and verbalized understanding.  Side effects of Flagyl were discussed with patient.      6:37 PM: Patient was reassessed prior to discharge. Patients symptoms Improved . I personally discussed discharge plan with patient/guardian, who understands instructions. All questions were answered.  Patient/guardian  advised to follow up with PMD and/or specialist. Patient/guardian instructed to return to the ER if symptoms persist, change or worsen. Patient agrees with plan.         Final Impression/Diagnosis:      Encounter Diagnoses              ICD-10-CM  ICD-9-CM          1.  Diverticulitis   K57.92  562.11           Patient condition at time of disposition: Stable         I have reviewed the following home medications:        Prior to Admission medications             Medication  Sig  Start Date  End Date  Taking?  Authorizing Provider            ciprofloxacin HCl (Cipro) 500 mg tablet  Take 1 Tablet by mouth  two (2) times a day for 7 days.  09/30/20  10/07/20  Yes  Sonnie Alamo, FNP             metroNIDAZOLE (FlagyL) 500 mg tablet  Take 1 Tablet by mouth three (3) times daily for 10 days.  09/30/20  10/10/20  Yes  Sonnie Alamo, FNP            ciprofloxacin HCl (Cipro) 500 mg tablet  Take 1 Tablet by mouth two (2) times a day for 7 days.  09/30/20  09/30/20    Sonnie Alamo, FNP     metroNIDAZOLE (FlagyL) 500 mg tablet  Take 1 Tablet by mouth three (3) times daily for 10 days.  09/30/20  09/30/20    Sonnie Alamo, FNP     levothyroxine (SYNTHROID) 150 mcg tablet  Take  by mouth Daily (before breakfast).        Provider, Historical            albuterol (PROVENTIL HFA, VENTOLIN HFA, PROAIR HFA) 90 mcg/actuation inhaler  Take 2 Puffs by inhalation every four (4) hours as needed for Wheezing.  02/17/16      Peterson Ao, APN                 Sonnie Alamo, FNP

## 2020-09-30 NOTE — ED Notes (Signed)
IV removed without incident.    I have reviewed discharge instructions with the patient.  The patient verbalized understanding.     Pt ambulated out of treatment area with a steady gait.

## 2020-10-01 LAB — EKG 12-LEAD
Atrial Rate: 65 {beats}/min
Diagnosis: NORMAL
P Axis: 25 degrees
P-R Interval: 136 ms
Q-T Interval: 406 ms
QRS Duration: 92 ms
QTc Calculation (Bazett): 422 ms
R Axis: 38 degrees
T Axis: 49 degrees
Ventricular Rate: 65 {beats}/min

## 2020-10-01 LAB — EKG, 12 LEAD, INITIAL
Atrial Rate: 65 {beats}/min
Calculated P Axis: 25 degrees
Calculated R Axis: 38 degrees
Calculated T Axis: 49 degrees
Diagnosis: NORMAL
P-R Interval: 136 ms
Q-T Interval: 406 ms
QRS Duration: 92 ms
QTC Calculation (Bezet): 422 ms
Ventricular Rate: 65 {beats}/min

## 2023-06-21 IMAGING — MR MRI RIGHT SHOULDER WITHOUT CONTRAST
5 of 6 series · 27 of 40 positions shown · IV contrast (gadolinium)
Comparison: None prior.

________________________________________________________________________________________________ 
MRI RIGHT SHOULDER WITHOUT CONTRAST, 06/21/2023 [DATE]: 
CLINICAL INDICATION: Incomplete rotator cuff tear/rupture. Elbow fracture last 
July. Some limited range of motion and pain.
TECHNIQUE: Multiplanar, multiecho position MR images of the shoulder were 
performed without intravenous gadolinium enhancement.

[Series 201: survey right · axial · right · 10.0mm · 0.71mm/px · z∈[-40,+125]mm · 5 of 15 slices shown]
[im 1/15]
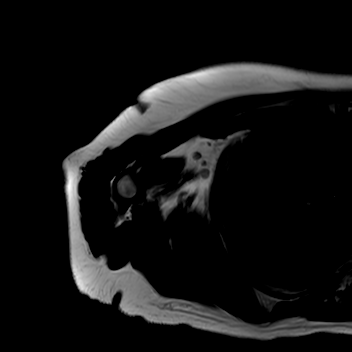
[im 4/15]
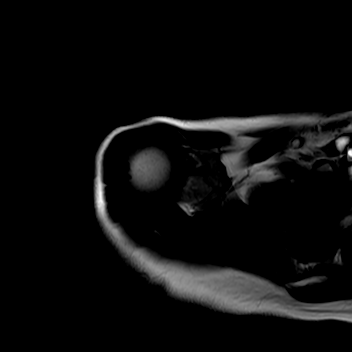
[im 8/15]
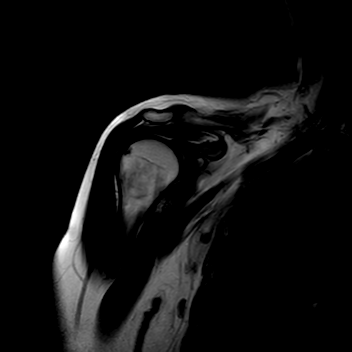
[im 11/15]
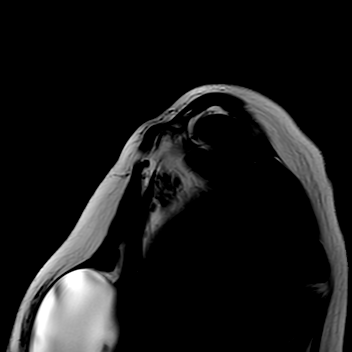
[im 15/15]
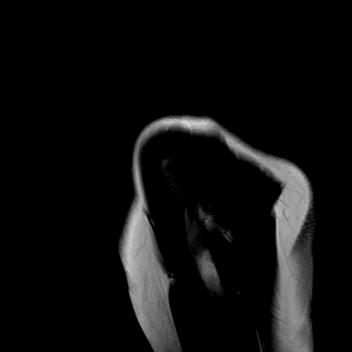

[Series 301: (person_name)_(person_name)_(person_name) right · axial · right · 3.5mm · 0.42mm/px · z∈[-31,+73]mm · 7 of 28 slices shown]
[im 1/28]
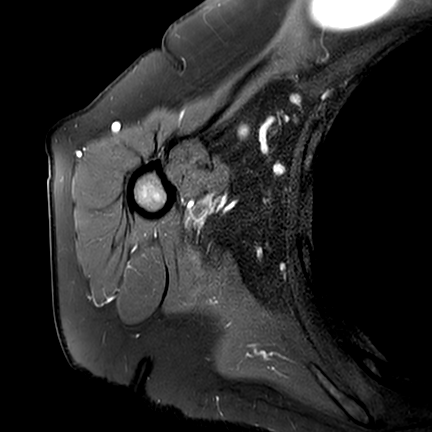
[im 5/28]
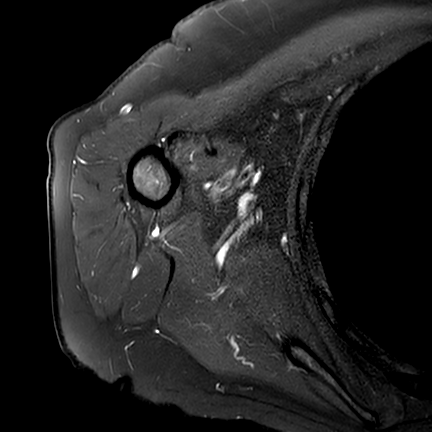
[im 10/28]
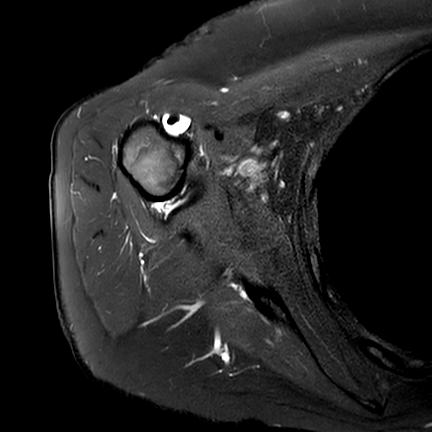
[im 14/28]
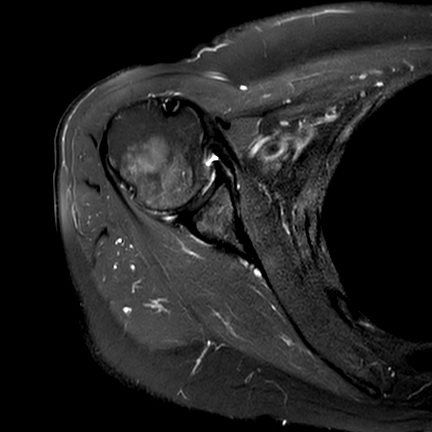
[im 19/28]
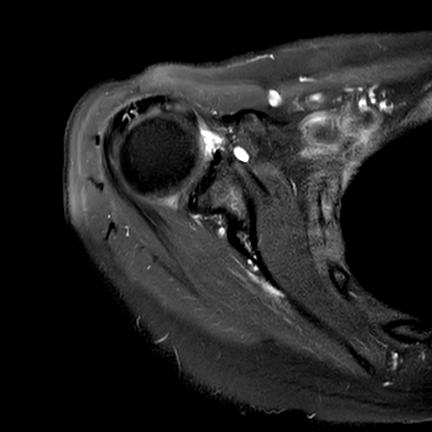
[im 23/28]
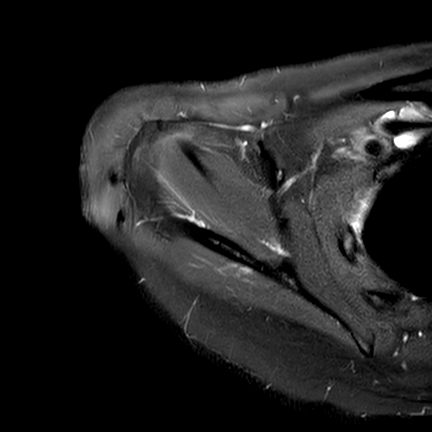
[im 28/28]
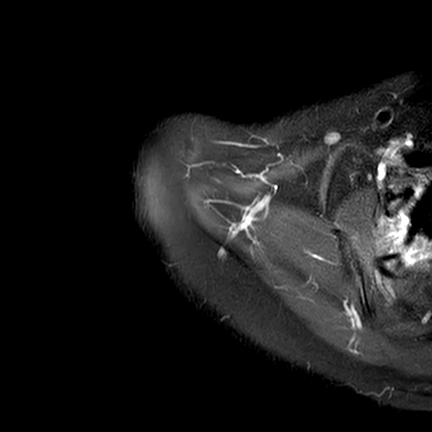

[Series 401: t2_fs_sag right · oblique · right · 3.5mm · 0.37mm/px · 8 of 30 slices shown]
[im 1/30]
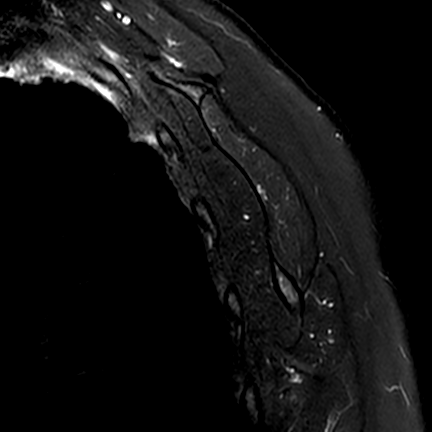
[im 5/30]
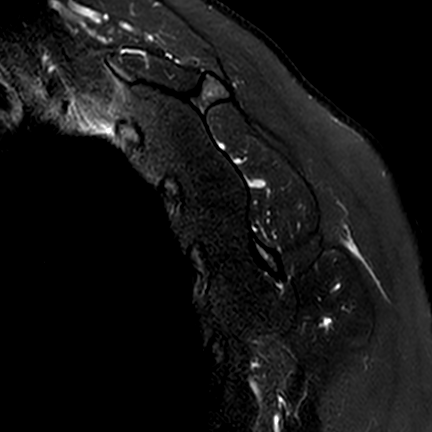
[im 9/30]
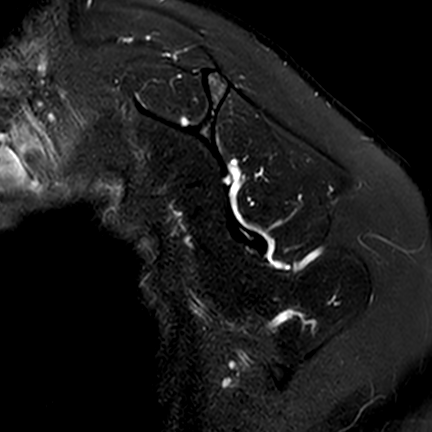
[im 13/30]
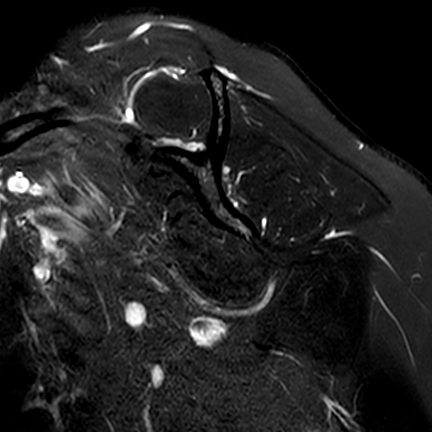
[im 17/30]
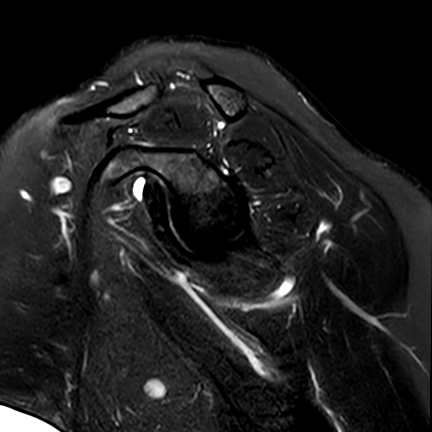
[im 21/30]
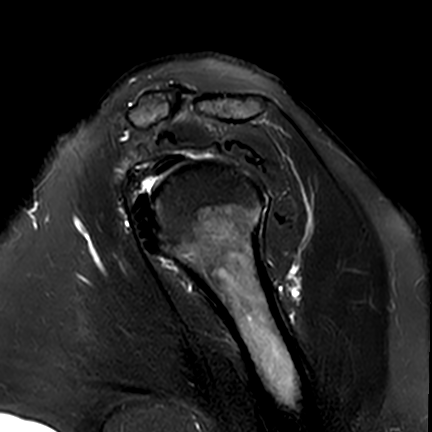
[im 25/30]
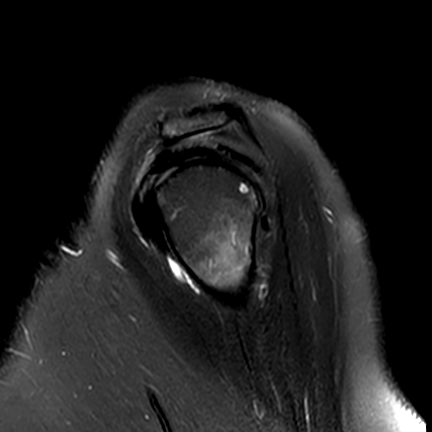
[im 30/30]
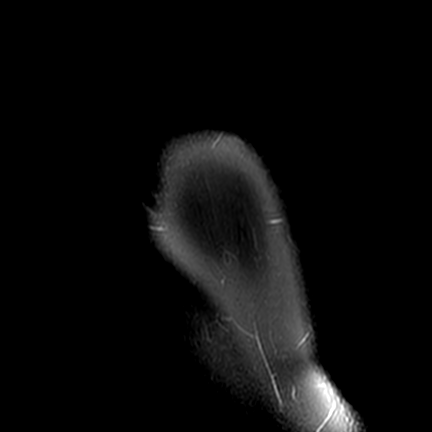

[Series 501: pd_fs_cor right · oblique · right · 3.5mm · 0.40mm/px · 6 of 24 slices shown]
[im 1/24]
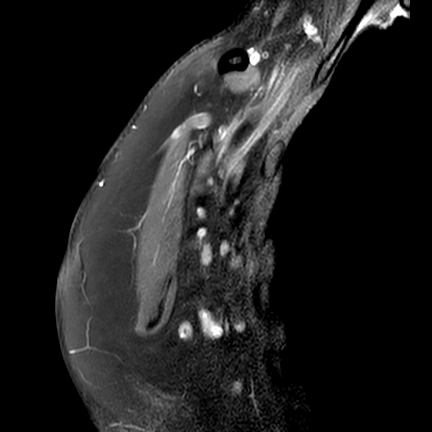
[im 5/24]
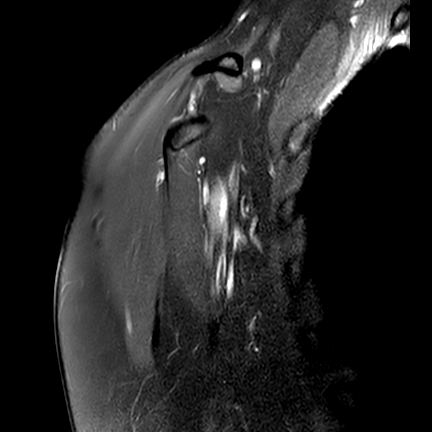
[im 10/24]
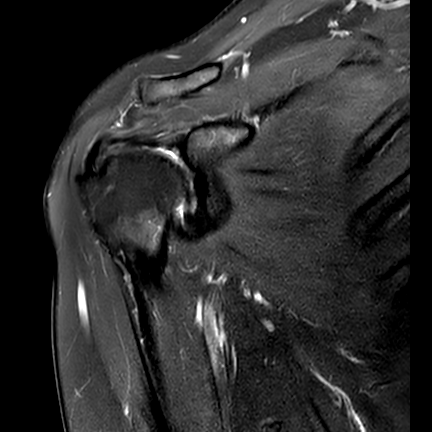
[im 14/24]
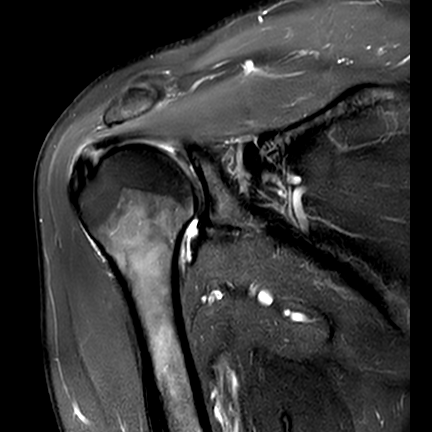
[im 19/24]
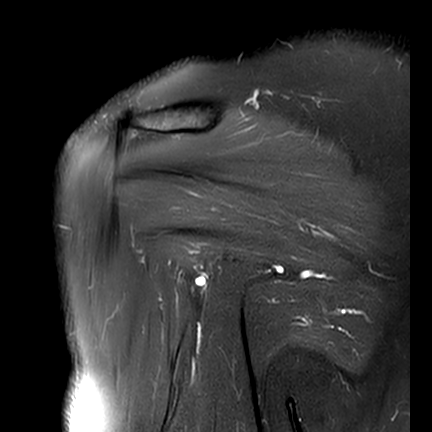
[im 24/24]
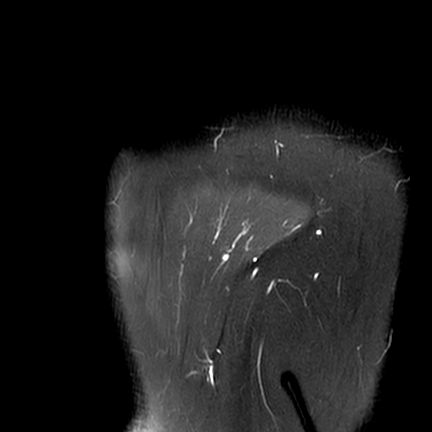

[Series 601: t1_sag right · oblique · right · 3.5mm · 0.31mm/px · 1 of 30 slices shown]
[im 1/30]
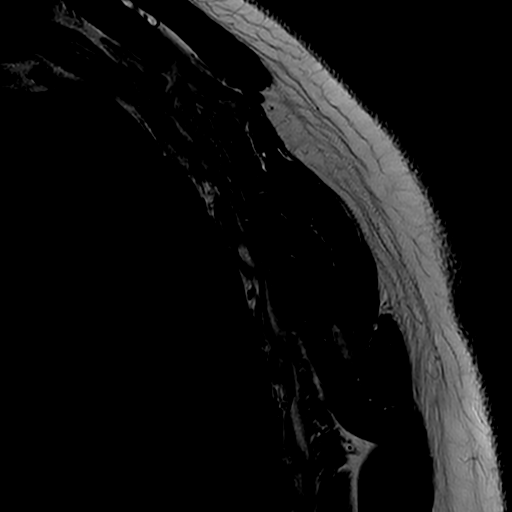

[27 of 40 positions shown; findings below may reference images not displayed]

FINDINGS: ROTATOR CUFF: There is both low-grade articular surface partial tearing 
concealed tearing of the supraspinatus tendon as seen for example on series 501, 
image 13. Undersurface tearing measures approximately 6 mm AP. The 
supraspinatus, infraspinatus, subscapularis and teres minor tendons are 
otherwise intact. No high-grade partial or full-thickness rotator cuff tear. The 
rotator cuff musculature is symmetric without mass, signal abnormality or 
atrophy. 
ACROMIOCLAVICULAR JOINT: Preserved. The coracoacromial ligament is intact 
without prominent spurring at the acromial attachment. The acromioclavicular and 
coracoclavicular ligaments are preserved. The acromium is normal in morphology. 
GLENOHUMERAL JOINT: The humeral head is well located within the glenoid fossa. 
Mild articular cartilaginous loss of the glenohumeral reticulation. Mild labral 
fraying.. No paralabral cyst. The intra-articular portion of the long head of 
the biceps tendon is negative. No shoulder joint effusion. 
BONES: The bone marrow signal intensity is negative for fracture. No Hill-Sachs 
defect. Subcortical cystic change of the humeral head. 
ADDITIONAL FINDINGS: The axillary region is negative. Subcutaneous tissues are 
negative.
IMPRESSION: 1.  Focal articular surface partial tearing and concealed interstitial tearing 
of the supraspinatus tendon. 
2.  Mild degenerative changes of the glenohumeral articulation.
